# Patient Record
Sex: Male | Born: 1950 | Race: White | Hispanic: No | Marital: Married | Smoking: Never smoker
Health system: Southern US, Community
[De-identification: ages and names within clinical notes are randomized; demographics above are authoritative.]

## PROBLEM LIST (undated history)

## (undated) DIAGNOSIS — C189 Malignant neoplasm of colon, unspecified: Secondary | ICD-10-CM

## (undated) DIAGNOSIS — I10 Essential (primary) hypertension: Secondary | ICD-10-CM

## (undated) DIAGNOSIS — C61 Malignant neoplasm of prostate: Secondary | ICD-10-CM

## (undated) DIAGNOSIS — Z8719 Personal history of other diseases of the digestive system: Secondary | ICD-10-CM

## (undated) HISTORY — DX: Malignant neoplasm of colon, unspecified: C18.9

## (undated) HISTORY — DX: Malignant neoplasm of prostate: C61

## (undated) HISTORY — DX: Essential (primary) hypertension: I10

## (undated) HISTORY — DX: Personal history of other diseases of the digestive system: Z87.19

---

## 1977-10-21 DIAGNOSIS — Z8711 Personal history of peptic ulcer disease: Secondary | ICD-10-CM

## 1977-10-21 HISTORY — DX: Personal history of peptic ulcer disease: Z87.11

## 1998-11-08 ENCOUNTER — Other Ambulatory Visit: Admission: RE | Admit: 1998-11-08 | Discharge: 1998-11-08 | Payer: Self-pay | Admitting: Gastroenterology

## 1998-11-27 ENCOUNTER — Ambulatory Visit (HOSPITAL_COMMUNITY): Admission: RE | Admit: 1998-11-27 | Discharge: 1998-11-27 | Payer: Self-pay | Admitting: Gastroenterology

## 1998-11-27 ENCOUNTER — Encounter: Payer: Self-pay | Admitting: Gastroenterology

## 1999-11-13 ENCOUNTER — Ambulatory Visit (HOSPITAL_COMMUNITY): Admission: RE | Admit: 1999-11-13 | Discharge: 1999-11-13 | Payer: Self-pay | Admitting: Gastroenterology

## 1999-11-13 ENCOUNTER — Encounter: Payer: Self-pay | Admitting: Gastroenterology

## 2006-10-21 DIAGNOSIS — C189 Malignant neoplasm of colon, unspecified: Secondary | ICD-10-CM

## 2006-10-21 DIAGNOSIS — C61 Malignant neoplasm of prostate: Secondary | ICD-10-CM

## 2006-10-21 HISTORY — DX: Malignant neoplasm of colon, unspecified: C18.9

## 2006-10-21 HISTORY — PX: COLON RESECTION: SHX5231

## 2006-10-21 HISTORY — DX: Malignant neoplasm of prostate: C61

## 2006-10-21 HISTORY — PX: PROSTATECTOMY: SHX69

## 2006-11-11 ENCOUNTER — Ambulatory Visit: Payer: Self-pay | Admitting: Gastroenterology

## 2006-11-11 LAB — CONVERTED CEMR LAB
AST: 24 units/L (ref 0–37)
Albumin: 4 g/dL (ref 3.5–5.2)
Alkaline Phosphatase: 100 units/L (ref 39–117)
BUN: 15 mg/dL (ref 6–23)
Basophils Absolute: 0 10*3/uL (ref 0.0–0.1)
Basophils Relative: 0.2 % (ref 0.0–1.0)
CO2: 30 meq/L (ref 19–32)
Calcium: 10 mg/dL (ref 8.4–10.5)
Creatinine, Ser: 1.4 mg/dL (ref 0.4–1.5)
Eosinophils Relative: 0.9 % (ref 0.0–5.0)
GFR calc Af Amer: 67 mL/min
GFR calc non Af Amer: 56 mL/min
HCT: 46.3 % (ref 39.0–52.0)
Hemoglobin: 16.3 g/dL (ref 13.0–17.0)
Lymphocytes Relative: 13.7 % (ref 12.0–46.0)
MCV: 86.6 fL (ref 78.0–100.0)
Neutro Abs: 8.5 10*3/uL — ABNORMAL HIGH (ref 1.4–7.7)
Neutrophils Relative %: 81.1 % — ABNORMAL HIGH (ref 43.0–77.0)
Sed Rate: 18 mm/hr (ref 0–20)
Total Bilirubin: 1.2 mg/dL (ref 0.3–1.2)
Total Protein: 7.4 g/dL (ref 6.0–8.3)
WBC: 10.4 10*3/uL (ref 4.5–10.5)

## 2006-11-12 ENCOUNTER — Ambulatory Visit: Payer: Self-pay | Admitting: Gastroenterology

## 2006-11-12 ENCOUNTER — Encounter (INDEPENDENT_AMBULATORY_CARE_PROVIDER_SITE_OTHER): Payer: Self-pay | Admitting: Specialist

## 2006-11-13 ENCOUNTER — Ambulatory Visit: Payer: Self-pay | Admitting: Cardiology

## 2006-12-04 ENCOUNTER — Encounter (INDEPENDENT_AMBULATORY_CARE_PROVIDER_SITE_OTHER): Payer: Self-pay | Admitting: Specialist

## 2006-12-04 ENCOUNTER — Inpatient Hospital Stay (HOSPITAL_COMMUNITY): Admission: RE | Admit: 2006-12-04 | Discharge: 2006-12-09 | Payer: Self-pay | Admitting: General Surgery

## 2006-12-25 ENCOUNTER — Ambulatory Visit: Payer: Self-pay | Admitting: Hematology & Oncology

## 2007-01-09 ENCOUNTER — Emergency Department (HOSPITAL_COMMUNITY): Admission: EM | Admit: 2007-01-09 | Discharge: 2007-01-09 | Payer: Self-pay | Admitting: Emergency Medicine

## 2007-01-23 ENCOUNTER — Ambulatory Visit (HOSPITAL_COMMUNITY): Admission: RE | Admit: 2007-01-23 | Discharge: 2007-01-23 | Payer: Self-pay | Admitting: Urology

## 2007-11-27 IMAGING — CT CT ABDOMEN W/ CM
2 of 5 series · 16 of 46 positions shown, 18 images · IV contrast (APPLIED)
Comparison: none

CLINICAL DATA: Constipation.  Blood in stool.  Right upper quadrant pain for about three weeks.  History of Crohn's disease.  Reportedly a mass noted on colonoscopy yesterday.  Malignant tumor in sigmoid colon stated on colonoscopy report.
 ABDOMEN CT WITH CONTRAST ? 11/13/06:
TECHNIQUE: Multidetector CT imaging of the abdomen was performed following the standard protocol during bolus administration of intravenous contrast. 
 Contrast:  125 cc Omnipaque 300 IV.
TECHNIQUE: Multidetector CT imaging of the pelvis was performed following the standard protocol during bolus administration of intravenous contrast.

[Series 2: abd_pel 5.0 b30f st · axial · 0.76mm/px · z∈[-477,-52]mm · 13 of 97 slices shown, 15 images]
[im 6/97  soft-tissue]
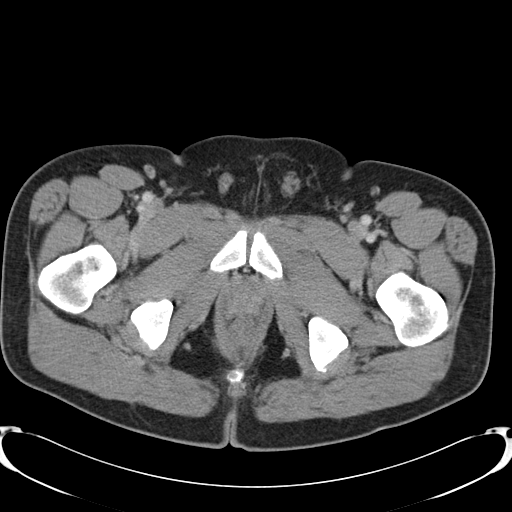
[im 6/97  bone]
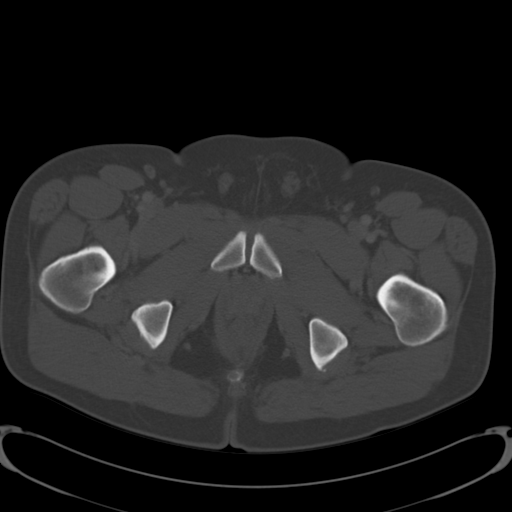
[im 12/97  soft-tissue]
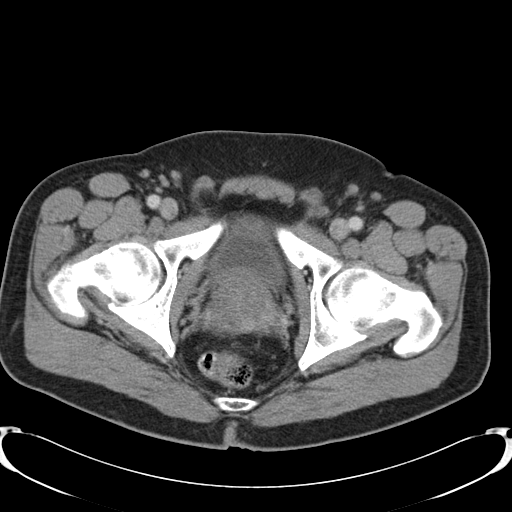
[im 23/97  soft-tissue]
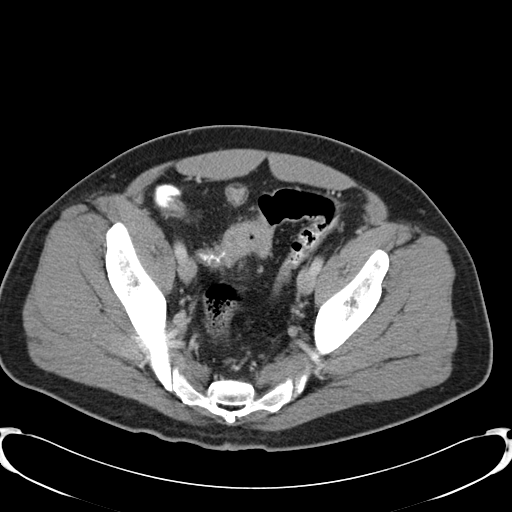
[im 29/97  soft-tissue]
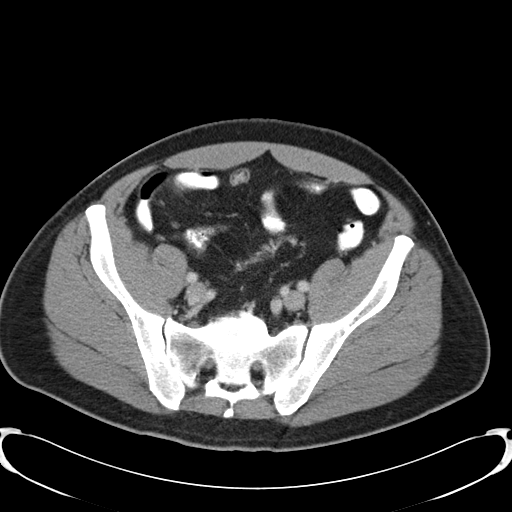
[im 34/97  soft-tissue]
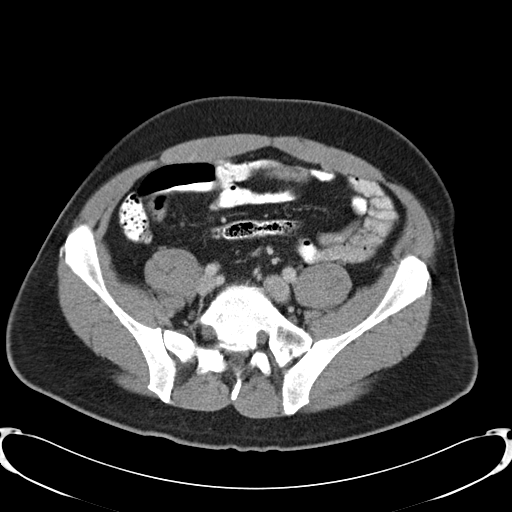
[im 40/97  soft-tissue]
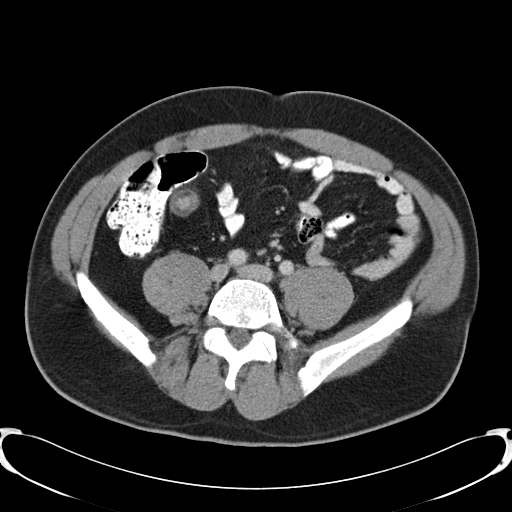
[im 51/97  soft-tissue]
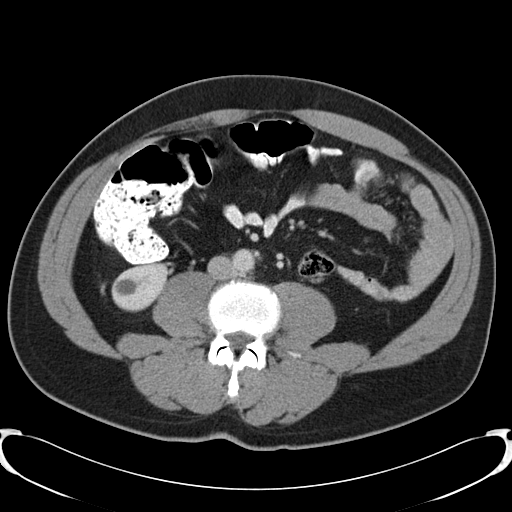
[im 57/97  soft-tissue]
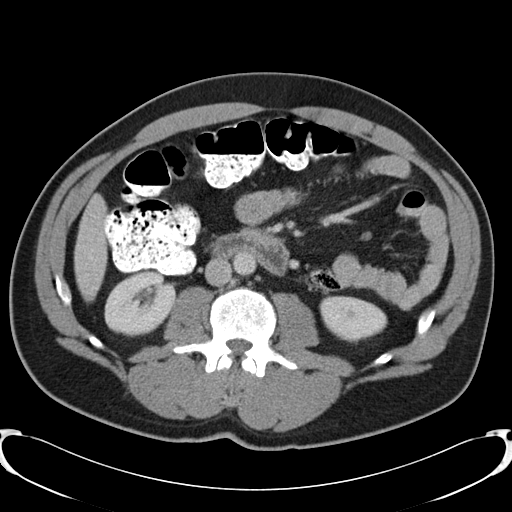
[im 63/97  soft-tissue]
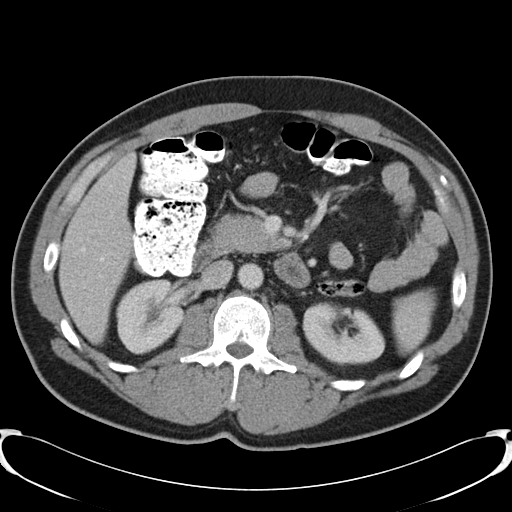
[im 63/97  bone]
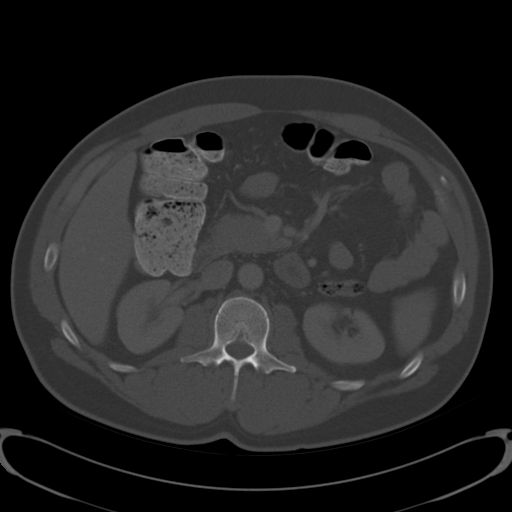
[im 68/97  soft-tissue]
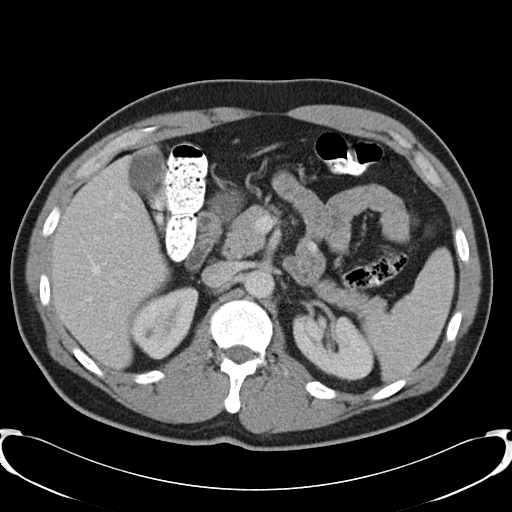
[im 74/97  soft-tissue]
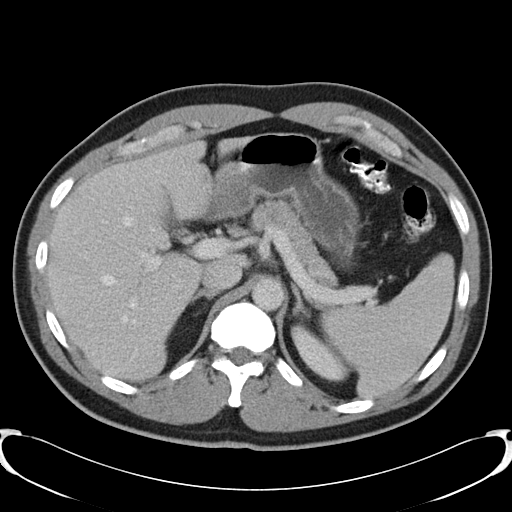
[im 85/97  soft-tissue]
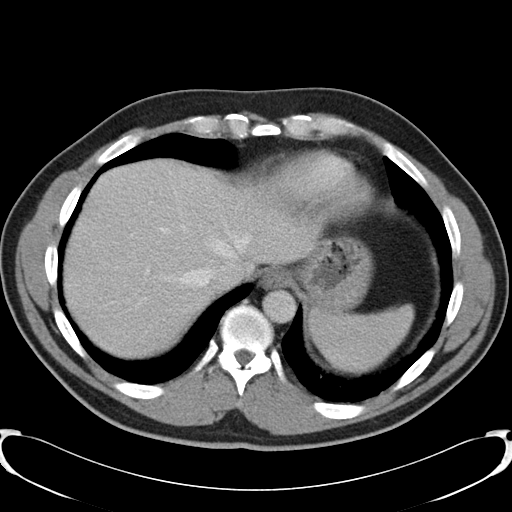
[im 91/97  soft-tissue]
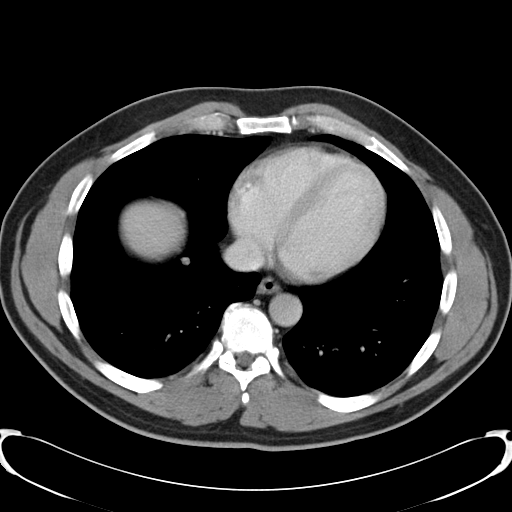

[Series 602: <mpr thick range> · coronal · 0.96mm/px · 3 of 88 slices shown]
[im 30/88  soft-tissue]
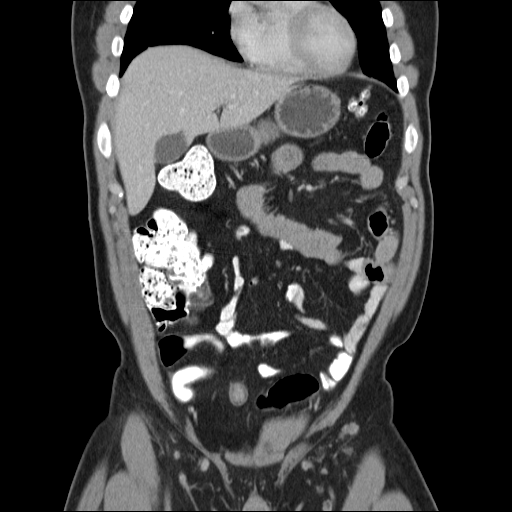
[im 39/88  soft-tissue]
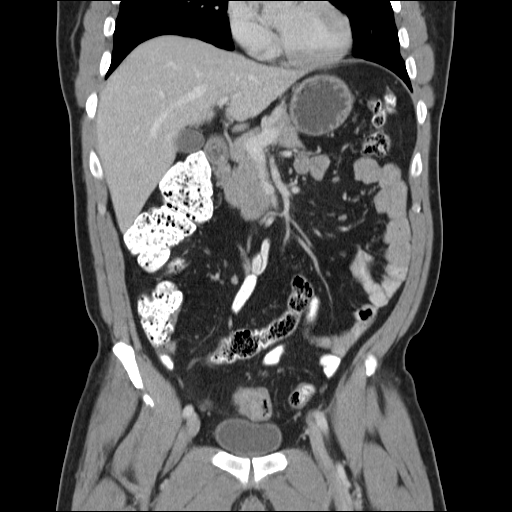
[im 49/88  soft-tissue]
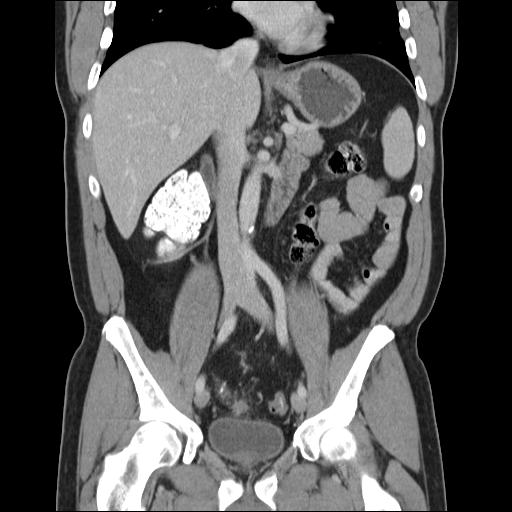

[16 of 46 positions shown; findings below may reference images not displayed]

FINDINGS: The liver, spleen, pancreas, adrenal glands, and left kidney are unremarkable.  There is a 1.6 x 1.9 cm cyst in the lower pole of the right kidney.  A right lower pole 5 mm calculus is noted as well.  No mesenteric abnormality.  No pathologically enlarged nodes.
IMPRESSION: Small right renal cyst and right lower pole calculus.  Otherwise negative abdominal CT.
 PELVIS CT WITH CONTRAST ? 11/13/06:
FINDINGS: A sigmoid colon mass is noted on transaxial images #[id number].  It measures approximately 3.8 cm in length and 3.4 cm in transverse dimension.  The finding is suspicious for primary colon carcinoma.  No evidence of colonic obstruction.  As stated on the 11/13/99 CT report, there are findings which would be compatible with chronic changes of Crohn's disease involving the terminal ileum.  The perienteric fat has a normal density.  There is so-called stratification of the wall of the terminal ileum with intramural fatty deposition.  This has been reported with chronic or residual changes of prior inflammation.  
 The prostate gland is enlarged measuring 5.3 cm in maximum transverse dimension.  The seminal vesicles are unremarkable.  There is mild diffuse bladder wall thickening likely due to hypertrophy.
 No pathologically enlarged pelvic lymph nodes.  Shotty-like nodes in the femoral and inguinal regions.
IMPRESSION: Findings compatible with sigmoid colon carcinoma.  See comments above for other observations.

## 2007-11-30 ENCOUNTER — Ambulatory Visit: Payer: Self-pay | Admitting: Gastroenterology

## 2007-12-09 ENCOUNTER — Encounter: Payer: Self-pay | Admitting: Gastroenterology

## 2007-12-09 ENCOUNTER — Ambulatory Visit: Payer: Self-pay | Admitting: Gastroenterology

## 2008-07-05 ENCOUNTER — Ambulatory Visit (HOSPITAL_COMMUNITY): Admission: RE | Admit: 2008-07-05 | Discharge: 2008-07-05 | Payer: Self-pay

## 2011-03-08 NOTE — Assessment & Plan Note (Signed)
[facility]                         GASTROENTEROLOGY OFFICE NOTE   NAME:Feutz, KAMDEN STANISLAW                   MRN:          [id number]  DATE:11/11/2006                            DOB:          [birth year]    Mr. Douthit returns after an 8 year hiatus for evaluation of  constipation.   I saw Mr. 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  He was on 100 mg a day,  frequent antibiotics, and there was consideration for Remicade  administration.  The patient was last seen on November 08, 1999, had a  colonoscopy in January of 2000.  He had previously been referred by Dr.  Kendrick Ranch.  Other medical problems at that time were essential  hypertension.  He was seen in the office in January of 2001 but I cannot  find that correspondence or office note in his records for some reason.   The patient apparently stopped all of his medications, did well for the  last 8 years.  For the last several weeks he has had increasing  constipation with ribbon-shaped stools and occasional mucus and blood.  Denies abdominal or rectal pain or fistula drainage around his rectum.  He has had no upper GI or hepatobiliary complaints, nausea and vomiting,  clay colored stools, dark urine, icterus, fever, chills, etc.  He is  following a regular diet and has recently increased the fiber in his  diet.  He has a vague history of peptic ulcer disease in 1980 but denies  dyspepsia at this time.  He has not been abusing NSAIDs, cigarettes, or  alcohol.   FAMILY HISTORY:  Unremarkable except for atherosclerosis.   SOCIAL HISTORY:  He is married and lives with his wife.  He has a  college degree.  He does not smoke or use alcohol.   REVIEW OF SYSTEMS:  Noncontributory.   MEDICATIONS:  None.   ALLERGIES:  None.   PHYSICAL EXAMINATION:  He is a  healthy-appearing white male in no  distress, appearing his stated age.  He is 6 feet tall and weighs 218  pounds.  Blood pressure is 168/90 and pulse is 80 and regular.  I could  not appreciate stigmata of chronic liver disease.  CHEST:  Clear.  CARDIAC:  No murmurs, rubs or gallops.  He is in regular rhythm.  ABDOMEN:  There is no hepatosplenomegaly, abdominal masses or  tenderness.  Bowel sounds were non obstructed.  PERIPHERAL EXTREMITIES:  Warm and unremarkable.  NEURO:  Mental status was clear.  INSPECTION OF RECTUM:  Shows some perianal erythema but no fissures or  fistulae or drainage. There were no areas of fluctuation or tenderness.  Rectal exam showed no mass or tenderness but a somewhat scarred rectum  that was dilated.  There was soft normal color stool present, plus-1  guaiac positive.   ASSESSMENT:  This patient has had a relatively benign course with fibro  stenosing Crohn's disease over the last 8 years without any therapy  whatsoever.  This is a  somewhat surprising clinical course.  I suspect  at this time he has perirectal disease and perhaps some sigmoid colon  stenosis.   RECOMMENDATIONS:  I have gone ahead and set him up for colonoscopy  tomorrow and sent him by the lab today for screening laboratory  parameters.  We will proceed accordingly depending on these results.     Vania Rea. Jarold Motto, MD, Caleen Essex, FAGA  Electronically Signed    DRP/MedQ  DD: 11/11/2006  DT: 11/11/2006  Job #: [id number]

## 2011-03-08 NOTE — Op Note (Signed)
NAMEEUDELL, MCPHEE NO.:  [id number]   MEDICAL RECORD NO.:  [id number]          PATIENT TYPE:  INP   LOCATION:  0001                         FACILITY:  [facility]   PHYSICIAN:  Angelia Mould. Derrell Lolling, M.D.DATE OF BIRTH:  [birth year]   DATE OF PROCEDURE:  12/04/2006  DATE OF DISCHARGE:                               OPERATIVE REPORT   PREOPERATIVE DIAGNOSES:  1. Adenocarcinoma of the sigmoid colon.  2. Prostatic hypertrophy.   POSTOPERATIVE DIAGNOSES:  1. 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  2. Prostatic hypertrophy.   OPERATION PERFORMED:  1. Sigmoid colectomy.  2. Cystotomy with excision of posterior bladder mass and bladder      repair (performed by Dr. Heloise Purpura).   ASSISTANT:  Dr. Manus Rudd.   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  He recently developed some cramping and constipation  and change in his bowel habits with blood in his stools.  Dr. Sheryn Bison performed a colonoscopy on January23,2008 and there was  a 5 cm long cancer in the sigmoid colon.  He also thought there were  some changes of Crohn's disease in the terminal ileum and cecum.  Biopsy  showed adenocarcinoma.  A CT scan showed a mass in the sigmoid colon but  no evidence of obstruction or metastatic disease.  It also showed a  markedly enlarged prostate gland measuring 5.3 cm in maximum dimension  with mild diffuse bladder wall thickening thought to be due to  hypertrophy.  Preoperative PSA level was 52.  The patient was informed  of his colon cancer and the possibility of prostate cancer.  This was  discussed with urology preop.  He has undergone a 2-day bowel prep at  home and is brought to the operating room electively.   OPERATIVE FINDINGS:  The  patient had a large neoplastic mass of the mid  sigmoid colon which was adherent and possibly superficially invading the  posterior wall of the bladder above the trigone.  This necessitated a  segmental colon resection as well as cystotomy and resection of the  posterior wall of the bladder and bladder repair.  There was no evidence  of any gross mesenteric adenopathy.  There was no ascites.  The small  bowel, colon, omentum, liver and stomach otherwise felt normal.   OPERATIVE TECHNIQUE:  Following the induction of general endotracheal  anesthesia, a Foley catheter was inserted that was uneventful.  The  urine was slightly blood-tinged.  The abdomen was prepped and draped in  a sterile fashion.  Intravenous antibiotics were given prior to the  surgery.  The patient was identified as the correct patient and correct  procedure.   A lower midline laparotomy was performed.  The abdomen was entered and  explored with findings as described above.  The sigmoid colon was finger  fractured away from the bladder.  We cleaned off the mesentery  7 or 8 cm  proximal and 7 or 8 cm distal to the tumor and transected the colon  between Allen clamps proximally and distally.  We did a wide mesenteric  resection all the way back to the retroperitoneum.  We identified the  left ureter.  The mesenteric vessels were divided with the LigaSure  device except for the large superior hemorrhoidal vessels which were  clamped, divided and doubly ligated with 2-0 silk ties.  The specimen  was removed and sent to pathology.  Dr. Jimmy Picket called and said  that we had several centimeters of margin on either side.   We performed an end-to-end anastomosis of the proximal and distal colon  using a single layer of interrupted silk sutures.  Corner sutures of 2-0  silk were placed to set up the anastomosis.  The posterior wall layer  with interrupted simple sutures of 3-0 silk.  At the medial and lateral  corners, we  used numerous interrupted inverting sutures of 3-0 silk.  After this turned the corners in quite nicely, we had used some simple  interrupted sutures anteriorly to complete the anastomosis.  This had no  tension on the anastomosis.  There was excellent blood supply.  The  anastomosis looked good to Dr. Corliss Skains and me. We irrigated out the lower  abdomen and pelvis.   By this time Dr. Heloise Purpura [facility] had come to the  operating room.  We asked him to assess and consider resecting the  posterior wall of the bladder.  He performed a cystotomy, resection of  the posterior wall of the bladder with a good margin and repaired the  cystotomy and the posterior wall in multiple layers.   We irrigated out the abdomen and pelvis.  All of the suture lines of the  bladder and on the colon looked good.  I placed Tisseel on the colon  anastomosis and let it harden for about 4 or 5 minutes.  I then brought  the omentum down and placed it between the colon and the bladder and it  seemed to provide a nice cushion between the two suture lines.  A 19-  Jamaica Blake drain was placed in the space of Retzius and brought out in  the right lower quadrant through a separate stab incision, sutured to  the skin with a nylon suture and connected to a suction bulb.  The  midline fascia was closed with a running suture of double-armed #1 PDS  being careful to close both the rectus fascia as well as the peritoneum.  This provided very secure closure.  The wound was irrigated with saline.  The skin was closed with skin staples.  Clean bandages were placed and  the patient taken to the recovery room in stable condition.  Estimated  blood loss was about 100 mL.  Complications none. Sponge, needle and  instrument counts were correct.      Angelia Mould. Derrell Lolling, M.D.  Electronically Signed     HMI/MEDQ  D:  12/04/2006  T:  12/04/2006  Job:  [id number]  cc:   Vania Rea. Jarold Motto, MD, FACG, FACP, FAGA  [address]  [place]  [place] [address]   Heloise Purpura, MD  Fax: [phone]

## 2011-03-08 NOTE — Discharge Summary (Signed)
Billy Wang, Billy Wang NO.:  [id number]   MEDICAL RECORD NO.:  [id number]          PATIENT TYPE:  INP   LOCATION:  1504                         FACILITY:  [facility]   PHYSICIAN:  Angelia Mould. Derrell Lolling, M.D.DATE OF BIRTH:  1951-09-01   DATE OF ADMISSION:  12/04/2006  DATE OF DISCHARGE:  12/09/2006                               DISCHARGE SUMMARY   FINAL DIAGNOSES:  1. Adenocarcinoma of the sigmoid colon, stage T3 N0.  2. Suspected invasion of bladder, not confirmed.  3. Remote history of Crohn's disease.  4. Enlarged prostate with bladder hypertrophy,suspect prostate cancer   OPERATIONS PERFORMED:  1. Sigmoid colectomy with primary anastomosis.  2. Cystotomy, excision of posterior bladder mass and bladder repair by      Dr. Crecencio Mc.  Date of surgery 12/04/2006.   HISTORY:  This is a 60 year old white man with a remote history of  Crohn's ileocolitis in the past, having been on  6 MP and  antibiotics  in years past.  More recently he has had some cramping and constipation  and changes in his bowel stool caliber, seeing  occasional mucus and  blood in stools.  No weight loss.  Dr. Jarold Motto evaluated him.  Colonoscopy was performed showing a 5 cm cancer in the sigmoid colon.  A  CT scan suggested a mass in the sigmoid colon being 3.8 cm in diameter  but no evidence of obstruction or metastatic disease.  Noted on the CT  scan was a very large prostate measuring 5.3 cm in maximal dimension  with mild diffuse bladder wall thickening suggesting hypertrophy.  The  patient was counseled as an outpatient regarding surgery and underwent a  bowel prep as an outpatient.  He had a PSA of 52 preop and a CEA of 0.9  preop.   HOSPITAL COURSE:  The course was brought to the hospital on the day of  surgery.  He underwent laparotomy and sigmoid colon resection.  The  cancer was densely adherent to the posterior dome of the bladder.  Dr.  Crecencio Mc, one of our urologists, resected  the posterior wall of the  bladder and repaired that.  There is no sign of metastatic disease.  We  discussed our concern about his markedly elevated PSA and he felt that  prostate biopsy be mandatory in the future.   Postoperatively the patient did well.  He had an ileus for a few days  but eventually he began passing flatus and having bowel movements and we  advanced his diet and activities without too much trouble.  His final  pathology report showed that this was a T3 N0 tumor with 0/18 nodes  negative and the bladder wall showed no evidence of invasive cancer.  He  was discharged on February19,2008.  He was tolerating a diet and having  stools at that time, hungry, abdomen was soft and healing well.  Foley  catheter was in place and Dr. Laverle Patter stated that he would arrange for a  cystogram in the office as an outpatient prior to removing the Foley.  I  told the patient about his  pathology  report.  The patient's is going to see Dr. Arlan Organ as an  outpatient and was given an appointment for that as well.  He was given  a prescription for Vicodin for pain and asked to follow up with me in  the office in 1 week.      Angelia Mould. Derrell Lolling, M.D.  Electronically Signed     HMI/MEDQ  D:  01/11/2007  T:  01/11/2007  Job:  [id number]   cc:   Vania Rea. Jarold Motto, MD, FACG, FACP, FAGA  [address]  [place]  [place] [address]   Crecencio Mc, M.D.  Fax: [phone]   Rose Phi. Myna Hidalgo, M.D.  Fax: [phone]

## 2011-03-08 NOTE — Op Note (Signed)
Billy Wang, MAYNES NO.:  [id number]   MEDICAL RECORD NO.:  [id number]          PATIENT TYPE:  INP   LOCATION:  0001                         FACILITY:  [facility]   PHYSICIAN:  Heloise Purpura, MD      DATE OF BIRTH:  Sep 27, 1951   DATE OF PROCEDURE:  12/04/2006  DATE OF DISCHARGE:                               OPERATIVE REPORT   PREOPERATIVE DIAGNOSIS:  Adenocarcinoma of the sigmoid colon with  extension into the posterior bladder.   POSTOPERATIVE DIAGNOSIS:  Adenocarcinoma of the sigmoid colon with  extension into the posterior bladder.   PROCEDURE:  Excision of posterior bladder with bladder repair.   SURGEON:  Crecencio Mc, M.D.   ANESTHESIA:  General.   COMPLICATIONS:  None.   INDICATIONS:  Mr. Easler is a 60 year old gentleman who was recently  diagnosed with adenocarcinoma of the sigmoid colon.  He underwent a  sigmoid colectomy, today, and intraoperatively was found to have  extension of his colon mass into the posterior bladder wall.  An  intraoperative consultation was, therefore, obtained.   DESCRIPTION OF PROCEDURE:  The patient was noted to have a mass on the  serosal surface of the peritoneum off the posterior bladder wall.  At  this point, the space of Retzius was developed thereby mobilizing the  bladder.  An anterior cystotomy incision was then made along the  anterior aspect of the bladder.  The mucosa was examined; and there was  no evidence of the extension into the bladder mucosa.  The mass did  appear, on the posterior aspect of the right side of the bladder on the  peritoneal surface.  Therefore, the right ureteral orifice was intubated  with a 0.035 guidewire and then a 5-French ureteral catheter was placed  over this wire to ensure proper identification of the ureter if  necessary.   The previously mentioned mass was then excised with a wide margin of  peritoneum through the full-thickness of the bladder wall.  The tissue  removed  did not appear to be extending into the trigone.  A 2-0 Vicryl  muscular layer was then used to close the opening in the bladder wall  posteriorly.  A 3-0 Vicryl mucosal layer was also performed.  On the  peritoneal side of the bladder, a 2-0 Vicryl suture was used to  reinforce this anastomosis.  The ureteral catheter was then removed.  The anterior cystotomy incision was then closed in two layers with a 3-0  Vicryl running mucosal layer and a 2-0 Vicryl seromuscular layer.  The  bladder was then filled with 300 mL of saline; and the anastomosis  appeared to be watertight.  A #19 Blake pelvic drain was left in place.  At this time, the procedure was turned back over to Dr. Claud Kelp,  for the completion of the procedure.          ______________________________  Heloise Purpura, MD  Electronically Signed    LB/MEDQ  D:  12/04/2006  T:  12/04/2006  Job:  [id number]

## 2011-12-04 ENCOUNTER — Telehealth: Payer: Self-pay | Admitting: Gastroenterology

## 2011-12-04 NOTE — Telephone Encounter (Signed)
DIRECT  °

## 2011-12-04 NOTE — Telephone Encounter (Signed)
Pt has not been seen since 1.22.08, but had a COLON 12/06/07 with path showing:   1.  ILEUM, BIOPSIES:  MILD ACTIVE INFLAMMATION WITH FOCAL EROSION.    2.  COLON, SIGMOID, ANASTOMOSIS, BIOPSIES:  REACTIVE CHANGES AND MINIMAL FOCAL ACTIVE INFLAMMATION.  NO ADENOMATOUS CHANGE OR EVIDENCE OF MALIGNANCY.   I do not have the chart, but pt reports he has a hx of Prostate Cancer and Colon Cnacer with surgery in 2008. He is followed by Dr Anson Fret at [facility] and he states he is having bowel changes and is requesting a COLON. Can pt have a DIRECT COLON or does he need an OV? Thanks.

## 2011-12-05 NOTE — Telephone Encounter (Signed)
Scheduled pt for PV and direct COLON.

## 2011-12-09 ENCOUNTER — Ambulatory Visit (AMBULATORY_SURGERY_CENTER): Payer: BC Managed Care – PPO | Admitting: *Deleted

## 2011-12-09 VITALS — Ht 72.0 in | Wt 228.7 lb

## 2011-12-09 DIAGNOSIS — Z85038 Personal history of other malignant neoplasm of large intestine: Secondary | ICD-10-CM

## 2011-12-09 DIAGNOSIS — R198 Other specified symptoms and signs involving the digestive system and abdomen: Secondary | ICD-10-CM

## 2011-12-09 MED ORDER — PEG-KCL-NACL-NASULF-NA ASC-C 100 G PO SOLR: ORAL | Status: DC

## 2011-12-11 ENCOUNTER — Other Ambulatory Visit: Payer: Self-pay | Admitting: Gastroenterology

## 2011-12-11 ENCOUNTER — Encounter: Payer: Self-pay | Admitting: Gastroenterology

## 2011-12-11 ENCOUNTER — Ambulatory Visit (AMBULATORY_SURGERY_CENTER): Payer: BC Managed Care – PPO | Admitting: Gastroenterology

## 2011-12-11 DIAGNOSIS — D126 Benign neoplasm of colon, unspecified: Secondary | ICD-10-CM

## 2011-12-11 DIAGNOSIS — R198 Other specified symptoms and signs involving the digestive system and abdomen: Secondary | ICD-10-CM | POA: Insufficient documentation

## 2011-12-11 DIAGNOSIS — K6289 Other specified diseases of anus and rectum: Secondary | ICD-10-CM | POA: Insufficient documentation

## 2011-12-11 DIAGNOSIS — Z85038 Personal history of other malignant neoplasm of large intestine: Secondary | ICD-10-CM | POA: Insufficient documentation

## 2011-12-11 DIAGNOSIS — K623 Rectal prolapse: Secondary | ICD-10-CM

## 2011-12-11 MED ORDER — HYDROCORTISONE ACE-PRAMOXINE 2.5-1 % RE CREA: TOPICAL_CREAM | Freq: Three times a day (TID) | RECTAL | Status: AC

## 2011-12-11 MED ORDER — SODIUM CHLORIDE 0.9 % IV SOLN: 500.0000 mL | INTRAVENOUS | Status: DC

## 2011-12-11 NOTE — Progress Notes (Signed)
Patient did not experience any of the following events: a burn prior to discharge; a fall within the facility; wrong site/side/patient/procedure/implant event; or a hospital transfer or hospital admission upon discharge from the facility. (G8907) Patient did not have preoperative order for IV antibiotic SSI prophylaxis. (G8918)  

## 2011-12-11 NOTE — Op Note (Signed)
[facility] [address] [place], [place]  [address]  COLONOSCOPY PROCEDURE REPORT  PATIENT:  Billy Wang, Billy Wang  MR#:  [id number] BIRTHDATE:  10/28/1950, 60 yrs. old  GENDER:  male ENDOSCOPIST:  Vania Rea. Jarold Motto, MD, [facility] REF. BY: PROCEDURE DATE:  12/11/2011 PROCEDURE:  Colonoscopy with snare polypectomy ASA CLASS:  Class III INDICATIONS:  history of colon cancer, constipation MEDICATIONS:   propofol (Diprivan) 300 mg IV  DESCRIPTION OF PROCEDURE:   After the risks and benefits and of the procedure were explained, informed consent was obtained. Digital rectal exam was performed and revealed perianal skin tags. The LB 180AL [id number] endoscope was introduced through the anus and advanced to the cecum, which was identified by both the appendix and ileocecal valve.  The quality of the prep was poor, using MoviPrep.  The instrument was then slowly withdrawn as the colon was fully examined. <<PROCEDUREIMAGES>>  FINDINGS:  There were multiple polyps identified and removed. in the rectum and sigmoid colon. small polyps hot snare excised other finding. large perianal papillae noted,anorectal polyp hot snare excised.   Retroflexed views in the rectum revealed hypertrophied anal papillae.    The scope was then withdrawn from the patient and the procedure completed.  COMPLICATIONS:  None ENDOSCOPIC IMPRESSION: 1) Polyps, multiple in the rectum and sigmoid colon 2) Other finding 3) Hypertrophied anal papillae 4) Anastomosis widely patent anastomosis noted RECOMMENDATIONS: 1) Await biopsy results 2) Repeat Colonoscopy in 5 years.  REPEAT EXAM:  No  ______________________________ Vania Rea. Jarold Motto, MD, [name]  CC:  Claud Kelp, MD  n. [name]:   Vania Rea. Patterson at 12/11/2011 10:55 AM  Rolanda Lundborg, [phone]

## 2011-12-11 NOTE — Patient Instructions (Signed)
Repeat colonoscopy in 5 years  YOU HAD AN ENDOSCOPIC PROCEDURE TODAY AT THE [facility]: Refer to the procedure report that was given to you for any specific questions about what was found during the examination.  If the procedure report does not answer your questions, please call your gastroenterologist to clarify.  If you requested that your care partner not be given the details of your procedure findings, then the procedure report has been included in a sealed envelope for you to review at your convenience later.  YOU SHOULD EXPECT: Some feelings of bloating in the abdomen. Passage of more gas than usual.  Walking can help get rid of the air that was put into your GI tract during the procedure and reduce the bloating. If you had a lower endoscopy (such as a colonoscopy or flexible sigmoidoscopy) you may notice spotting of blood in your stool or on the toilet paper. If you underwent a bowel prep for your procedure, then you may not have a normal bowel movement for a few days.  DIET: Your first meal following the procedure should be a light meal and then it is ok to progress to your normal diet.  A half-sandwich or bowl of soup is an example of a good first meal.  Heavy or fried foods are harder to digest and may make you feel nauseous or bloated.  Likewise meals heavy in dairy and vegetables can cause extra gas to form and this can also increase the bloating.  Drink plenty of fluids but you should avoid alcoholic beverages for 24 hours.  ACTIVITY: Your care partner should take you home directly after the procedure.  You should plan to take it easy, moving slowly for the rest of the day.  You can resume normal activity the day after the procedure however you should NOT DRIVE or use heavy machinery for 24 hours (because of the sedation medicines used during the test).    SYMPTOMS TO REPORT IMMEDIATELY: A gastroenterologist can be reached at any hour.  During normal business hours, 8:30 AM to  5:00 PM Monday through Friday, call [phone].  After hours and on weekends, please call the GI answering service at [phone] who will take a message and have the physician on call contact you.   Following lower endoscopy (colonoscopy or flexible sigmoidoscopy):  Excessive amounts of blood in the stool  Significant tenderness or worsening of abdominal pains  Swelling of the abdomen that is new, acute  Fever of 100F or higher  Following upper endoscopy (EGD)  Vomiting of blood or coffee ground material  New chest pain or pain under the shoulder blades  Painful or persistently difficult swallowing  New shortness of breath  Fever of 100F or higher  Black, tarry-looking stools  FOLLOW UP: If any biopsies were taken you will be contacted by phone or by letter within the next 1-3 weeks.  Call your gastroenterologist if you have not heard about the biopsies in 3 weeks.  Our staff will call the home number listed on your records the next business day following your procedure to check on you and address any questions or concerns that you may have at that time regarding the information given to you following your procedure. This is a courtesy call and so if there is no answer at the home number and we have not heard from you through the emergency physician on call, we will assume that you have returned to your regular daily activities without incident.  SIGNATURES/CONFIDENTIALITY: You and/or your care partner have signed paperwork which will be entered into your electronic medical record.  These signatures attest to the fact that that the information above on your After Visit Summary has been reviewed and is understood.  Full responsibility of the confidentiality of this discharge information lies with you and/or your care-partner.   Colon Polyps A polyp is extra tissue that grows inside your body. Colon polyps grow in the large intestine. The large intestine, also called the colon, is  part of your digestive system. It is a long, hollow tube at the end of your digestive tract where your body makes and stores stool. Most polyps are not dangerous. They are benign. This means they are not cancerous. But over time, some types of polyps can turn into cancer. Polyps that are smaller than a pea are usually not harmful. But larger polyps could someday become or may already be cancerous. To be safe, doctors remove all polyps and test them.  WHO GETS POLYPS? Anyone can get polyps, but certain people are more likely than others. You may have a greater chance of getting polyps if:  You are over 50.   You have had polyps before.   Someone in your family has had polyps.   Someone in your family has had cancer of the large intestine.   Find out if someone in your family has had polyps. You may also be more likely to get polyps if you:   Eat a lot of fatty foods.   Smoke.   Drink alcohol.   Do not exercise.   Eat too much.  SYMPTOMS  Most small polyps do not cause symptoms. People often do not know they have one until their caregiver finds it during a regular checkup or while testing them for something else. Some people do have symptoms like these:  Bleeding from the anus. You might notice blood on your underwear or on toilet paper after you have had a bowel movement.   Constipation or diarrhea that lasts more than a week.   Blood in the stool. Blood can make stool look black or it can show up as red streaks in the stool.  If you have any of these symptoms, see your caregiver. HOW DOES THE DOCTOR TEST FOR POLYPS? The doctor can use four tests to check for polyps:  Digital rectal exam. The caregiver wears gloves and checks your rectum (the last part of the large intestine) to see if it feels normal. This test would find polyps only in the rectum. Your caregiver may need to do one of the other tests listed below to find polyps higher up in the intestine.   Barium enema. The  caregiver puts a liquid called barium into your rectum before taking x-rays of your large intestine. Barium makes your intestine look white in the pictures. Polyps are dark, so they are easy to see.   Sigmoidoscopy. With this test, the caregiver can see inside your large intestine. A thin flexible tube is placed into your rectum. The device is called a sigmoidoscope, which has a light and a tiny video camera in it. The caregiver uses the sigmoidoscope to look at the last third of your large intestine.   Colonoscopy. This test is like sigmoidoscopy, but the caregiver looks at all of the large intestine. It usually requires sedation. This is the most common method for finding and removing polyps.  TREATMENT   The caregiver will remove the polyp during sigmoidoscopy or colonoscopy.  The polyp is then tested for cancer.   If you have had polyps, your caregiver may want you to get tested regularly in the future.  PREVENTION  There is not one sure way to prevent polyps. You might be able to lower your risk of getting them if you:  Eat more fruits and vegetables and less fatty food.   Do not smoke.   Avoid alcohol.   Exercise every day.   Lose weight if you are overweight.   Eating more calcium and folate can also lower your risk of getting polyps. Some foods that are rich in calcium are milk, cheese, and broccoli. Some foods that are rich in folate are chickpeas, kidney beans, and spinach.   Aspirin might help prevent polyps. Studies are under way.  Document Released: 07/03/2004 Document Revised: 06/19/2011 Document Reviewed: 12/09/2007 [facility] Patient Information 2012 [place], [place].

## 2011-12-12 ENCOUNTER — Telehealth: Payer: Self-pay

## 2011-12-12 NOTE — Telephone Encounter (Signed)
  Follow up Call-  Call back number 12/11/2011  Post procedure Call Back phone  # [phone]     Patient questions:  Do you have a fever, pain , or abdominal swelling? no Pain Score  0 *  Have you tolerated food without any problems? yes  Have you been able to return to your normal activities? yes  Do you have any questions about your discharge instructions: Diet   no Medications  no Follow up visit  no  Do you have questions or concerns about your Care? no  Actions: * If pain score is 4 or above: No action needed, pain <4.

## 2011-12-18 ENCOUNTER — Encounter: Payer: Self-pay | Admitting: Gastroenterology

## 2014-02-11 ENCOUNTER — Other Ambulatory Visit (HOSPITAL_COMMUNITY): Payer: Self-pay | Admitting: *Deleted

## 2014-02-11 DIAGNOSIS — R110 Nausea: Secondary | ICD-10-CM

## 2014-02-11 DIAGNOSIS — R17 Unspecified jaundice: Secondary | ICD-10-CM

## 2014-02-11 DIAGNOSIS — R109 Unspecified abdominal pain: Secondary | ICD-10-CM

## 2014-02-15 ENCOUNTER — Telehealth: Payer: Self-pay | Admitting: Oncology

## 2014-02-15 NOTE — Telephone Encounter (Signed)
LEFT MESSAGE FOR PATIENT TO RETURN CALL TO SCHEDULE NP APPT.  °

## 2014-02-16 ENCOUNTER — Telehealth: Payer: Self-pay | Admitting: Oncology

## 2014-02-16 ENCOUNTER — Ambulatory Visit (HOSPITAL_COMMUNITY)
Admission: RE | Admit: 2014-02-16 | Discharge: 2014-02-16 | Disposition: A | Discharge status: Home / Self Care | Payer: BC Managed Care – PPO | Source: Ambulatory Visit | Attending: Internal Medicine | Admitting: Internal Medicine

## 2014-02-16 DIAGNOSIS — R110 Nausea: Secondary | ICD-10-CM | POA: Insufficient documentation

## 2014-02-16 DIAGNOSIS — R17 Unspecified jaundice: Secondary | ICD-10-CM

## 2014-02-16 DIAGNOSIS — R109 Unspecified abdominal pain: Secondary | ICD-10-CM

## 2014-02-16 MED ORDER — TECHNETIUM TC 99M MEBROFENIN IV KIT
5.5000 | PACK | Freq: Once | INTRAVENOUS | Status: AC | PRN
  Administered 2014-02-16: 5.5 via INTRAVENOUS

## 2014-02-16 NOTE — Telephone Encounter (Signed)
LEFT MESSAGE FOR PATIENT TO RETURN CALL TO SCHEDULE NP APPT.  °

## 2014-03-07 ENCOUNTER — Telehealth: Payer: Self-pay | Admitting: Oncology

## 2014-03-07 NOTE — Telephone Encounter (Signed)
LEFT MESSAGE FOR PATIENT TO RETURN CALL TO SCHEDULE NP APPT.  °

## 2014-03-22 ENCOUNTER — Telehealth: Payer: Self-pay | Admitting: Oncology

## 2014-03-22 NOTE — Telephone Encounter (Signed)
LEFT MESSAGE FOR PATIENT TO RETURN CALL TO [place] NP APPT. REFERRING OFFICE IS AWARE NOT ABLE TO REACH PATIENT.

## 2014-03-23 ENCOUNTER — Telehealth: Payer: Self-pay | Admitting: Oncology

## 2014-03-23 NOTE — Telephone Encounter (Signed)
C/D 03/23/14 for appt. 03/25/14

## 2014-03-23 NOTE — Telephone Encounter (Signed)
S/W PATIENT WIFE(PAM) AND GAVE NP APPT FOR 06/05 @ 1:30 W/DR. SHADAD.  REFERRING DR. Alfonso Patten ERNEST COHN DX- PROSTATE CA THAT HAS METS

## 2014-03-24 ENCOUNTER — Other Ambulatory Visit: Payer: Self-pay | Admitting: Oncology

## 2014-03-24 DIAGNOSIS — C61 Malignant neoplasm of prostate: Secondary | ICD-10-CM

## 2014-03-25 ENCOUNTER — Other Ambulatory Visit (HOSPITAL_BASED_OUTPATIENT_CLINIC_OR_DEPARTMENT_OTHER): Payer: BC Managed Care – PPO

## 2014-03-25 ENCOUNTER — Ambulatory Visit (HOSPITAL_BASED_OUTPATIENT_CLINIC_OR_DEPARTMENT_OTHER): Payer: BC Managed Care – PPO | Admitting: Oncology

## 2014-03-25 ENCOUNTER — Telehealth: Payer: Self-pay | Admitting: Oncology

## 2014-03-25 ENCOUNTER — Ambulatory Visit: Payer: BC Managed Care – PPO

## 2014-03-25 VITALS — BP 153/70 | HR 100 | Temp 97.4°F | Resp 18 | Ht 72.0 in | Wt 172.3 lb

## 2014-03-25 DIAGNOSIS — C7951 Secondary malignant neoplasm of bone: Secondary | ICD-10-CM

## 2014-03-25 DIAGNOSIS — C7952 Secondary malignant neoplasm of bone marrow: Secondary | ICD-10-CM

## 2014-03-25 DIAGNOSIS — C61 Malignant neoplasm of prostate: Secondary | ICD-10-CM

## 2014-03-25 LAB — COMPREHENSIVE METABOLIC PANEL (CC13)
ALT: 25 U/L (ref 0–55)
ANION GAP: 16 meq/L — AB (ref 3–11)
AST: 20 U/L (ref 5–34)
Albumin: 2.9 g/dL — ABNORMAL LOW (ref 3.5–5.0)
Alkaline Phosphatase: 263 U/L — ABNORMAL HIGH (ref 40–150)
BILIRUBIN TOTAL: 1.14 mg/dL (ref 0.20–1.20)
BUN: 17.3 mg/dL (ref 7.0–26.0)
CO2: 22 meq/L (ref 22–29)
CREATININE: 0.9 mg/dL (ref 0.7–1.3)
Calcium: 9.5 mg/dL (ref 8.4–10.4)
Chloride: 103 mEq/L (ref 98–109)
GLUCOSE: 139 mg/dL (ref 70–140)
Potassium: 3.9 mEq/L (ref 3.5–5.1)
Sodium: 141 mEq/L (ref 136–145)
Total Protein: 7 g/dL (ref 6.4–8.3)

## 2014-03-25 LAB — CBC WITH DIFFERENTIAL/PLATELET
BASO%: 1 % (ref 0.0–2.0)
BASOS ABS: 0.1 10*3/uL (ref 0.0–0.1)
EOS ABS: 0.1 10*3/uL (ref 0.0–0.5)
EOS%: 0.7 % (ref 0.0–7.0)
HEMATOCRIT: 31.9 % — AB (ref 38.4–49.9)
HGB: 10.6 g/dL — ABNORMAL LOW (ref 13.0–17.1)
LYMPH%: 24.9 % (ref 14.0–49.0)
MCH: 27.5 pg (ref 27.2–33.4)
MCHC: 33.4 g/dL (ref 32.0–36.0)
MCV: 82.4 fL (ref 79.3–98.0)
MONO#: 0.3 10*3/uL (ref 0.1–0.9)
MONO%: 4.1 % (ref 0.0–14.0)
NEUT%: 69.3 % (ref 39.0–75.0)
NEUTROS ABS: 5.7 10*3/uL (ref 1.5–6.5)
PLATELETS: 252 10*3/uL (ref 140–400)
RBC: 3.87 10*6/uL — ABNORMAL LOW (ref 4.20–5.82)
RDW: 14.7 % — ABNORMAL HIGH (ref 11.0–14.6)
WBC: 8.3 10*3/uL (ref 4.0–10.3)
lymph#: 2.1 10*3/uL (ref 0.9–3.3)

## 2014-03-25 LAB — TECHNOLOGIST REVIEW

## 2014-03-25 NOTE — Progress Notes (Signed)
Please see consult

## 2014-03-25 NOTE — Telephone Encounter (Signed)
gv adn rpitned appts ched and avs for opt for July

## 2014-03-25 NOTE — Consult Note (Signed)
Reason for Referral: Prostate cancer.   HPI: This is a pleasant 63 year old gentleman of [place]. He lived the majority of his life there and work predominantly as a Psychologist, sport and exercise. He has a past medical history significant for colon cancer in 2008. He status post sigmoid colon colectomy in February of 2008 and his tumor was found to be moderately differentiated adenocarcinoma with the pathological staging of T3 N0. He was also incidentally to found to have increased PSA up to 53. He was evaluated by Dr. Alinda Money and was confirmed to have prostate cancer with a biopsy. He elected to have a prostatectomy done at [facility] which was done on 04/20/2007. The pathology showed a Gleason score 4+3 equals 7 with his PSA being around 45.9. His PSA nadir was close to 3 and on January of 2009 his PSA was 4.9. He was started on Lupron in January of 2009 and his PSA went to 0.01 till January of 2014 with his PSA started to rise up to 21.9. At that time, he underwent a bone scan that was unremarkable and was recommended to start Zytiga which she declined and failed to show up at [facility] followup. Most recently, he started developing symptoms of fatigue tiredness and failure to thrive. He also started developing right-sided hip pain. He was evaluated by primary care physician specialize in homeopathic medicine and his laboratory testing revealed a ALP of 134 and a PSA over 600. He underwent a MRI which is not available to me and shouldn't prostate cancer in the bone predominantly in the left hip. Patient referred to me for further evaluation. In the meantime, he has not had any treatments for his prostate cancer and takes no medications. He elected to use alternative medicine approaches including coffee enemas an attempt to cleanse his liver.  Clinically, he does not report any headaches blurred vision double vision. Did not report any motor or sensory neuropathy or  alteration of mental status. Is not reporting any psychiatric issues or depression. He does not report any fevers or chills or sweats. He does not report any chest pain shortness of breath cough or hemoptysis. He does report nausea but no vomiting. He does report for by mouth intake and about 10 pound weight loss. His that report any hematochezia or melena. Does not report any frequency urgency or hesitancy. He does reports hip pain and back pain. But no neurological deficits or loss of bowel or bladder function. He does not report any lymphadenopathy or petechiae. He does not report skin rashes or lesions.   Past Medical History  Diagnosis Date  . Colon cancer 2008  . Prostate cancer 2008  . Hypertension   . History of stomach ulcers 1979  :  Past Surgical History  Procedure Laterality Date  . Colon resection  2008  . Prostatectomy  2008  :   he takes no medication at this time.   No Known Allergies:  Family History  Problem Relation Age of Onset  . Colon cancer Neg Hx   . Stomach cancer Neg Hx   :  History   Social History  . Marital Status: Married    Spouse Name: N/A    Number of Children: N/A  . Years of Education: N/A   Occupational History  . Not on file.   Social History Main Topics  . Smoking status: Never Smoker   . Smokeless tobacco: Never Used  . Alcohol Use: No  . Drug  Use: No  . Sexual Activity: Not on file   Other Topics Concern  . Not on file   Social History Narrative  . No narrative on file  :  Pertinent items are noted in HPI.  Exam: Blood pressure 153/70, pulse 100, temperature 97.4 F (36.3 C), temperature source Oral, resp. rate 18, height 6' (1.829 m), weight 172 lb 4.8 oz (78.155 kg), SpO2 100.00%. General appearance: alert and cooperative Head: Normocephalic, without obvious abnormality Throat: lips, mucosa, and tongue normal; teeth and gums normal Neck: no adenopathy and thyroid not enlarged, symmetric, no  tenderness/mass/nodules Back: symmetric, no curvature. ROM normal. No CVA tenderness. Resp: clear to auscultation bilaterally Cardio: regular rate and rhythm, S1, S2 normal, no murmur, click, rub or gallop GI: soft, non-tender; bowel sounds normal; no masses,  no organomegaly Extremities: extremities normal, atraumatic, no cyanosis or edema Pulses: 2+ and symmetric Skin: Skin color, texture, turgor normal. No rashes or lesions Lymph nodes: Cervical, supraclavicular, and axillary nodes normal.   Recent Labs  03/25/14 1345  WBC 8.3  HGB 10.6*  HCT 31.9*  PLT 252     Assessment and Plan:   63 year old gentleman with the following issues:  1. Castration resistant prostate cancer with metastatic disease to the bone. His initial diagnosis back in 2008 with a Gleason score 4+3 equals 7 in the PSA around 45.9. He is status post prostatectomy with his PSA continued to be detectable. He was treated with androgen depravation till January of 2014 where he developed castration resistant disease. He had declined conventional therapy and have been receiving alternative therapy since that time. Most recently, he developed bony metastasis and a PSA of over 600. The natural course of this disease was discussed today in detail with the patient and his wife. I shared with him that this cancer is not curable but certainly we can help palliate his symptoms. Palliative measures that include systemic therapy with hormone agents such as Zytiga, Xtandi, systemic chemotherapy or palliative radiation therapy modalities. All these although not curative, there certainly can be palliative in nature and off for better "he of life and extend survival. Patient and his wife are quite skeptical about conventional treatment for cancer and they're not really sure if he wants to pursue any. He worried about side effects and financial complication associated without and I tried to explain to alleviate some of these concerns  today. He is agreeable to undergoing staging workup with a bone scan and a followup to discuss these results and treatment options. I feel he would be a reasonable candidate for Xtandi with possible palliative radiation therapy to the pelvis if needed to. I also recommended he stops any alternative cancer treatments as it might interfere with conventional therapy that we would prescribe.  2. Androgen depravation: I feel this is to be resumed in the form of Lupron as well as offer better control of his disease.   3 .Bone directed therapy he would also benefit from extra use which certainly will be addressed in future visits.

## 2014-03-26 LAB — PSA: PSA: 2796 ng/mL — AB (ref <=4.00)

## 2014-03-26 LAB — TESTOSTERONE: Testosterone: 220 ng/dL — ABNORMAL LOW (ref 300–890)

## 2014-04-04 ENCOUNTER — Ambulatory Visit (HOSPITAL_COMMUNITY)
Admission: RE | Admit: 2014-04-04 | Discharge: 2014-04-04 | Disposition: A | Discharge status: Home / Self Care | Payer: BC Managed Care – PPO | Source: Ambulatory Visit | Attending: Oncology | Admitting: Oncology

## 2014-04-04 ENCOUNTER — Encounter (HOSPITAL_COMMUNITY)
Admission: RE | Admit: 2014-04-04 | Discharge: 2014-04-04 | Disposition: A | Discharge status: Home / Self Care | Payer: BC Managed Care – PPO | Source: Ambulatory Visit | Attending: Oncology | Admitting: Oncology

## 2014-04-04 DIAGNOSIS — C7952 Secondary malignant neoplasm of bone marrow: Principal | ICD-10-CM

## 2014-04-04 DIAGNOSIS — C61 Malignant neoplasm of prostate: Secondary | ICD-10-CM

## 2014-04-04 DIAGNOSIS — C7951 Secondary malignant neoplasm of bone: Secondary | ICD-10-CM | POA: Insufficient documentation

## 2014-04-04 MED ORDER — TECHNETIUM TC 99M MEDRONATE IV KIT
25.9000 | PACK | Freq: Once | INTRAVENOUS | Status: AC | PRN
  Administered 2014-04-04: 25.9 via INTRAVENOUS

## 2014-04-20 ENCOUNTER — Encounter: Payer: Self-pay | Admitting: Oncology

## 2014-04-20 ENCOUNTER — Ambulatory Visit (HOSPITAL_BASED_OUTPATIENT_CLINIC_OR_DEPARTMENT_OTHER): Payer: BC Managed Care – PPO | Admitting: Oncology

## 2014-04-20 VITALS — BP 144/75 | HR 108 | Temp 97.4°F | Resp 18 | Ht 72.0 in | Wt 164.0 lb

## 2014-04-20 DIAGNOSIS — C7952 Secondary malignant neoplasm of bone marrow: Secondary | ICD-10-CM

## 2014-04-20 DIAGNOSIS — F411 Generalized anxiety disorder: Secondary | ICD-10-CM

## 2014-04-20 DIAGNOSIS — C61 Malignant neoplasm of prostate: Secondary | ICD-10-CM

## 2014-04-20 DIAGNOSIS — C7951 Secondary malignant neoplasm of bone: Secondary | ICD-10-CM

## 2014-04-20 MED ORDER — TRAMADOL HCL 50 MG PO TABS: 50.0000 mg | ORAL_TABLET | Freq: Four times a day (QID) | ORAL | Status: AC | PRN

## 2014-04-20 MED ORDER — DIAZEPAM 5 MG PO TABS: 5.0000 mg | ORAL_TABLET | Freq: Four times a day (QID) | ORAL | Status: AC | PRN

## 2014-04-20 NOTE — Addendum Note (Signed)
Addended by: Randolm Idol on: 04/20/2014 11:06 AM   Modules accepted: Orders

## 2014-04-20 NOTE — Progress Notes (Signed)
Hematology and Oncology Follow Up Visit  Billy Wang [id number] 06/30/1951 63 y.o. 04/20/2014 10:52 AM Billy Wang, Billy Wang, MDFesko, Billy Calico, MD   Principle Diagnosis: 63 year old gentleman with castration resistant metastatic prostate cancer with disease to the bone. He was initially diagnosed in 2008. He had a Gleason score 4+3 equals 7 PSA of 45.9.   Prior Therapy: He is status post prostatectomy done at [facility] which was done on 04/20/2007. The pathology showed a Gleason score 4+3 equals 7 with his PSA being around 45.9. His PSA nadir was close to 3 and on January of 2009 his PSA was 4.9.  He was started on Lupron in January of 2009 and his PSA went to 0.01 till January of 2014 with his PSA started to rise up to 21.9. At that time, he underwent a bone scan that was unremarkable and was recommended to start Zytiga which she declined and failed to show up at [facility] followup  Current therapy: Supportive care only he has declined therapy for the time being.  Interim History:  Billy Wang presents today for a followup visit with his wife. Since his last visit, he continues to have a slow decline in his health. He is reporting more left-sided hip pain and pain with ambulation. He is also reporting poor by mouth intake and weight loss. He is not reporting any chest pain or shortness of breath. Is not reporting any cough or hemoptysis. Has not reported any vomiting but he is nauseated today. He does report occasional anxiety and fatigue. He does not report any headaches or blurry vision or double vision. Doesn't report any syncope. His ambulation still reasonable but a skinny more difficult to be very steady at this time.  Medications: I have reviewed the patient's current medications.  Current Outpatient Prescriptions  Medication Sig Dispense Refill  . diazepam (VALIUM) 5 MG tablet Take 1 tablet (5 mg total) by mouth every 6 (six) hours as needed for  anxiety.  30 tablet  0  . traMADol (ULTRAM) 50 MG tablet Take 1 tablet (50 mg total) by mouth every 6 (six) hours as needed.  30 tablet  0   No current facility-administered medications for this visit.     Allergies: No Known Allergies  Past Medical History, Surgical history, Social history, and Family History were reviewed and updated.  Review of Systems: Constitutional:  Negative for fever, chills, night sweats, anorexia, weight loss, pain. Cardiovascular: no chest pain or dyspnea on exertion Respiratory: no cough, shortness of breath, or wheezing Neurological: no TIA or stroke symptoms Dermatological: negative ENT: negative Skin: Negative. Gastrointestinal: no abdominal pain, change in bowel habits, or black or bloody stools Genito-Urinary: no dysuria, trouble voiding, or hematuria Hematological and Lymphatic: negative Breast: negative Musculoskeletal: negative Remaining ROS negative. Physical Exam: Blood pressure 144/75, pulse 108, temperature 97.4 F (36.3 C), temperature source Oral, resp. rate 18, height 6' (1.829 m), weight 164 lb (74.39 kg). ECOG:  General appearance: alert and cooperative Head: Normocephalic, without obvious abnormality Neck: no adenopathy Lymph nodes: Cervical, supraclavicular, and axillary nodes normal. Heart:regular rate and rhythm, S1, S2 normal, no murmur, click, rub or gallop Lung:chest clear, no wheezing, rales, normal symmetric air entry Abdomin: soft, non-tender, without masses or organomegaly EXT:no erythema, induration, or nodules   Lab Results: Lab Results  Component Value Date   WBC 8.3 03/25/2014   HGB 10.6* 03/25/2014   HCT 31.9* 03/25/2014   MCV 82.4 03/25/2014   PLT 252 03/25/2014  Chemistry      Component Value Date/Time   NA 141 03/25/2014 1345   NA 142 11/11/2006 1602   K 3.9 03/25/2014 1345   K 4.8 11/11/2006 1602   CL 105 11/11/2006 1602   CO2 22 03/25/2014 1345   CO2 30 11/11/2006 1602   BUN 17.3 03/25/2014 1345   BUN 15  11/11/2006 1602   CREATININE 0.9 03/25/2014 1345   CREATININE 1.4 11/11/2006 1602      Component Value Date/Time   CALCIUM 9.5 03/25/2014 1345   CALCIUM 10.0 11/11/2006 1602   ALKPHOS 263* 03/25/2014 1345   ALKPHOS 100 11/11/2006 1602   AST 20 03/25/2014 1345   AST 24 11/11/2006 1602   ALT 25 03/25/2014 1345   ALT 18 11/11/2006 1602   BILITOT 1.14 03/25/2014 1345   BILITOT 1.2 11/11/2006 1602       Radiological Studies:  EXAM:  NUCLEAR MEDICINE WHOLE BODY BONE SCAN  TECHNIQUE:  Whole body anterior and posterior images were obtained approximately  3 hours after intravenous injection of radiopharmaceutical.  RADIOPHARMACEUTICALS: 25.9 mCi Technetium-99 MDP  COMPARISON: Total body bone scan at 07/05/2008  FINDINGS:  Expected biodistribution in the kidneys and urinary bladder.  Diffuse multifocal areas of osseous remodeling scattered throughout  the ribs bilaterally. An intense area of osseous remodeling within  the proximal clavicle on the left. Punctate area of osseous  remodeling within the distal femoral shaft on the right. Scattered  punctate foci of osseous remodeling within the calvarium right side  greater than left. A vague area of osseous remodeling appreciated  involving the right lateral aspect of the L5 vertebral body.  IMPRESSION:  Multifocal areas of osseous remodeling within the left clavicle,  ribs bilaterally, right femur, sternum, and calvarium consistent  with metastatic disease until proven otherwise.  Findings suspicious for metastatic disease within the right lateral  aspect of the L5 vertebral body.   Impression and Plan:   63 year old gentleman with the following issues:  1. Castration resistant metastatic prostate cancer with disease to the bone. His PSA is close to 2800 with widespread bony metastasis. Options of treatments were discussed again including systemic chemotherapy, hormonal agent such as Zytiga or Xtandi. He still very skeptical about taking  medications and not sure he will try any. Clearly his cancer is progressing quite rapidly and I explained that to him that none of these agents will be curative at any point. I anticipate that he has limited life expectancy and really should consider hospice sooner rather than later if he continues to refuse therapy.  2. Pelvic pain: I have recommended at least an evaluation by radiation oncology as a possible palliative approach. I've also given him a prescription for tramadol for pain. He refused oncotic analgesics at this time.  3. Anxiety I gave him a prescription for Valium which will help him with nausea, anxiety and pain.  Overall prognosis for this gentleman unfortunately is very poor with very limited life expectancy. With treatment, we will be able to palliate his symptoms better but he continues to refuse. I will continue to follow up on him to make sure his symptoms are taking care of and to readdress the issues discussed today.  [facility], MD 7/1/201510:52 AM

## 2014-04-25 ENCOUNTER — Encounter: Payer: Self-pay | Admitting: *Deleted

## 2014-04-25 NOTE — Progress Notes (Signed)
[facility] Psychosocial Distress Screening Clinical Social Work  Clinical Social Work was referred by distress screening protocol.  The patient scored a 8 on the Psychosocial Distress Thermometer which indicates severe distress. Clinical Social Worker attempted to contact patient to assess for distress and other psychosocial needs. CSW spoke with patient's spouse- she stated he remains nauseous and has difficulty getting out of bed.  Patient's spouse states they are "doing the best we can" but did not elaborate.  CSW provided brief emotional support and encouraged patient/family to utilize support services.  Patient's spouse not interested at this time.  ONCBCN DISTRESS SCREENING 04/20/2014  Elta [place] the number that describes how much distress you have been experiencing in the past week 8  Emotional problem type (No Data)  Spiritual/Religous concerns type Loss of sense of purpose  Physical Problem type Pain;Nausea/vomiting;Getting around;Other (comment)    Clinical Social Worker follow up needed: No.  If yes, follow up plan:   Polo Riley, MSW, LCSW, OSW-C Clinical Social Worker [facility] [phone]

## 2014-05-18 ENCOUNTER — Ambulatory Visit (HOSPITAL_BASED_OUTPATIENT_CLINIC_OR_DEPARTMENT_OTHER): Payer: BC Managed Care – PPO | Admitting: Oncology

## 2014-05-18 ENCOUNTER — Other Ambulatory Visit: Payer: Self-pay | Admitting: Medical Oncology

## 2014-05-18 ENCOUNTER — Encounter: Payer: Self-pay | Admitting: Medical Oncology

## 2014-05-18 VITALS — BP 119/57 | HR 94 | Temp 97.5°F | Resp 18 | Ht 72.0 in

## 2014-05-18 DIAGNOSIS — C7952 Secondary malignant neoplasm of bone marrow: Secondary | ICD-10-CM

## 2014-05-18 DIAGNOSIS — C61 Malignant neoplasm of prostate: Secondary | ICD-10-CM

## 2014-05-18 DIAGNOSIS — Z85038 Personal history of other malignant neoplasm of large intestine: Secondary | ICD-10-CM

## 2014-05-18 DIAGNOSIS — R109 Unspecified abdominal pain: Secondary | ICD-10-CM

## 2014-05-18 DIAGNOSIS — F411 Generalized anxiety disorder: Secondary | ICD-10-CM

## 2014-05-18 DIAGNOSIS — C7951 Secondary malignant neoplasm of bone: Secondary | ICD-10-CM

## 2014-05-18 MED ORDER — OXYCODONE-ACETAMINOPHEN 5-325 MG PO TABS
ORAL_TABLET | ORAL | Status: AC
  Filled 2014-05-18: qty 2

## 2014-05-18 MED ORDER — OXYCODONE HCL 5 MG PO TABS: 5.0000 mg | ORAL_TABLET | ORAL | Status: AC | PRN

## 2014-05-18 MED ORDER — OXYCODONE-ACETAMINOPHEN 5-325 MG PO TABS
2.0000 | ORAL_TABLET | Freq: Once | ORAL | Status: AC
  Administered 2014-05-18: 2 via ORAL

## 2014-05-18 NOTE — Progress Notes (Signed)
Hematology and Oncology Follow Up Visit  Billy Wang [id number] August 18, 1951 63 y.o. 05/18/2014 3:19 PM Billy Wang, Billy Wang, MDFesko, Billy Calico, MD   Principle Diagnosis: 63 year old gentleman with castration resistant metastatic prostate cancer with disease to the bone. He was initially diagnosed in 2008. He had a Gleason score 4+3 equals 7 PSA of 45.9.   Prior Therapy: He is status post prostatectomy done at [facility] which was done on 04/20/2007. The pathology showed a Gleason score 4+3 equals 7 with his PSA being around 45.9. His PSA nadir was close to 3 and on January of 2009 his PSA was 4.9.  He was started on Lupron in January of 2009 and his PSA went to 0.01 till January of 2014 with his PSA started to rise up to 21.9. At that time, he underwent a bone scan that was unremarkable and was recommended to start Zytiga which she declined and failed to show up at [facility] followup  Current therapy: Supportive care only he has declined therapy for the time being.  Interim History:  Billy Wang presents today for a followup visit with his wife. Since his last visit, he continues to have decline in his health. He is reporting more left-sided hip pain and pain and he is not able to ambulate at this time. He chart tramadol for pain without any benefit. He is unable to sleep and his by mouth intake have decreased. He continues to lose weight and decline clinically. He is not reporting any chest pain or shortness of breath. Is not reporting any cough or hemoptysis. Has not reported any vomiting. He does report occasional anxiety and fatigue. He does not report any headaches or blurry vision or double vision. Doesn't report any syncope. He is in a wheelchair today and have very limited mobility at this time. Rest of his review of systems unremarkable.  Medications: I have reviewed the patient's current medications.  Current Outpatient Prescriptions  Medication Sig  Dispense Refill  . diazepam (VALIUM) 5 MG tablet Take 1 tablet (5 mg total) by mouth every 6 (six) hours as needed for anxiety.  30 tablet  0  . oxyCODONE (OXY IR/ROXICODONE) 5 MG immediate release tablet Take 1 tablet (5 mg total) by mouth every 4 (four) hours as needed for severe pain.  30 tablet  0  . traMADol (ULTRAM) 50 MG tablet Take 1 tablet (50 mg total) by mouth every 6 (six) hours as needed.  30 tablet  0   No current facility-administered medications for this visit.   Facility-Administered Medications Ordered in Other Visits  Medication Dose Route Frequency Provider Last Rate Last Dose  . oxyCODONE-acetaminophen (PERCOCET/ROXICET) 5-325 MG per tablet 2 tablet  2 tablet Oral Once Billy Portela, MD         Allergies: No Known Allergies  Past Medical History, Surgical history, Social history, and Family History were reviewed and updated.   Physical Exam: Blood pressure 119/57, pulse 94, temperature 97.5 F (36.4 C), temperature source Oral, resp. rate 18, height 6' (1.829 m), weight 0 lb (0 kg). ECOG: 2 General appearance: alert, cooperative and Appeared in mild distress. Head: Normocephalic, without obvious abnormality Neck: no adenopathy Lymph nodes: Cervical, supraclavicular, and axillary nodes normal. Heart:regular rate and rhythm, S1, S2 normal, no murmur, click, rub or gallop Lung:chest clear, no wheezing, rales, normal symmetric air entry Abdomin: soft, non-tender, without masses or organomegaly EXT:no erythema, induration, or nodules. 1+ edema noted   Lab Results: Lab  Results  Component Value Date   WBC 8.3 03/25/2014   HGB 10.6* 03/25/2014   HCT 31.9* 03/25/2014   MCV 82.4 03/25/2014   PLT 252 03/25/2014     Chemistry      Component Value Date/Time   NA 141 03/25/2014 1345   NA 142 11/11/2006 1602   K 3.9 03/25/2014 1345   K 4.8 11/11/2006 1602   CL 105 11/11/2006 1602   CO2 22 03/25/2014 1345   CO2 30 11/11/2006 1602   BUN 17.3 03/25/2014 1345   BUN 15 11/11/2006 1602    CREATININE 0.9 03/25/2014 1345   CREATININE 1.4 11/11/2006 1602      Component Value Date/Time   CALCIUM 9.5 03/25/2014 1345   CALCIUM 10.0 11/11/2006 1602   ALKPHOS 263* 03/25/2014 1345   ALKPHOS 100 11/11/2006 1602   AST 20 03/25/2014 1345   AST 24 11/11/2006 1602   ALT 25 03/25/2014 1345   ALT 18 11/11/2006 1602   BILITOT 1.14 03/25/2014 1345   BILITOT 1.2 11/11/2006 1602        Impression and Plan:   63 year old gentleman with the following issues:  1. Castration resistant metastatic prostate cancer with disease to the bone. His PSA is close to 2800 with widespread bony metastasis. He has declined all treatment options in the past and unfortunately he is developing end-stage disease. At this time, his performance status has been very poor and really not a candidate for any treatment. I discussed with him the role of hospice as we are dealing with end-of-life situation. His prognosis is very poor with very limited life expectancy of less than 6 months. He would qualify for hospice and we will make the appropriate arrangements. He is agreeable and understand this point the gravity of his diagnosis.  2. Pelvic pain: I have recommended at least an evaluation by radiation oncology as a possible palliative approach. He continues to decline radiation therapy but agreeable to try a different pain medications this time. I gave her a prescription for oxycodone with instructions how to take as well as he is appropriate bowel regimen.  3. Anxiety is currently on Valium which have helped the symptoms.    [facility], MD 7/29/20153:19 PM

## 2014-05-18 NOTE — Progress Notes (Signed)
Per MD, hospice referral made to [facility]. Spoke with Gwenlyn Fudge and referral made. Patient and spouse aware to expect call from Hospice regarding in home appt.  Orders for Hospice to do symptom management and Dr. Alen Blew to be attending given to [facility].

## 2014-05-27 ENCOUNTER — Telehealth: Payer: Self-pay | Admitting: *Deleted

## 2014-05-27 NOTE — Telephone Encounter (Signed)
Wt Readings from Last 3 Encounters:  04/20/14 164 lb (74.39 kg)  03/25/14 172 lb 4.8 oz (78.155 kg)  12/11/11 228 lb (103.42 kg)   Dx: Prostate Cancer with wt loss.  Spoke with pt's sister-in-law, she states pt is eating a little better and drinking Boost along with Activia.  Encouraged small frequent high kcal/high protein meals and snacks.  Recommend supplements as tolerated.  Mailed information on high kcal/high protein foods, coupons for supplements and [facility] phone number for questions or support.

## 2014-06-03 ENCOUNTER — Telehealth: Payer: Self-pay | Admitting: Medical Oncology

## 2014-06-03 NOTE — Telephone Encounter (Signed)
Hospice and [facility] reporting patient admitted today to [facility] for terminal restlessness.  MD inboxed.

## 2014-06-08 ENCOUNTER — Telehealth: Payer: Self-pay | Admitting: *Deleted

## 2014-06-21 NOTE — Telephone Encounter (Signed)
rec'd call from nancy, at [facility]. Patient expired. All future appts  Deleted. Dr Alen Blew notified.

## 2014-06-21 DEATH — deceased
